# Patient Record
Sex: Female | Born: 1990 | Race: Black or African American | Hispanic: No | Marital: Single | Smoking: Never smoker
Health system: Southern US, Community
[De-identification: ages and names within clinical notes are randomized; demographics above are authoritative.]

## PROBLEM LIST (undated history)

## (undated) HISTORY — PX: CHOLECYSTECTOMY: SHX55

---
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
## 2014-01-14 ENCOUNTER — Encounter (HOSPITAL_COMMUNITY): Payer: Self-pay | Admitting: Emergency Medicine

## 2014-01-14 ENCOUNTER — Emergency Department (HOSPITAL_COMMUNITY)
Admission: EM | Admit: 2014-01-14 | Discharge: 2014-01-14 | Disposition: A | Discharge status: Home / Self Care | Attending: Emergency Medicine | Admitting: Emergency Medicine

## 2014-01-14 DIAGNOSIS — A088 Other specified intestinal infections: Secondary | ICD-10-CM | POA: Insufficient documentation

## 2014-01-14 DIAGNOSIS — A084 Viral intestinal infection, unspecified: Secondary | ICD-10-CM

## 2014-01-14 DIAGNOSIS — Z3202 Encounter for pregnancy test, result negative: Secondary | ICD-10-CM | POA: Insufficient documentation

## 2014-01-14 LAB — CBC WITH DIFFERENTIAL/PLATELET
BASOS ABS: 0 10*3/uL (ref 0.0–0.1)
Basophils Relative: 0 % (ref 0–1)
Eosinophils Absolute: 0.2 10*3/uL (ref 0.0–0.7)
Eosinophils Relative: 4 % (ref 0–5)
HEMATOCRIT: 37.4 % (ref 36.0–46.0)
Hemoglobin: 12.8 g/dL (ref 12.0–15.0)
LYMPHS ABS: 1.2 10*3/uL (ref 0.7–4.0)
LYMPHS PCT: 21 % (ref 12–46)
MCH: 29.5 pg (ref 26.0–34.0)
MCHC: 34.2 g/dL (ref 30.0–36.0)
MCV: 86.2 fL (ref 78.0–100.0)
MONO ABS: 0.3 10*3/uL (ref 0.1–1.0)
Monocytes Relative: 6 % (ref 3–12)
NEUTROS ABS: 4 10*3/uL (ref 1.7–7.7)
Neutrophils Relative %: 69 % (ref 43–77)
Platelets: 220 10*3/uL (ref 150–400)
RBC: 4.34 MIL/uL (ref 3.87–5.11)
RDW: 12.5 % (ref 11.5–15.5)
WBC: 5.8 10*3/uL (ref 4.0–10.5)

## 2014-01-14 LAB — URINALYSIS, ROUTINE W REFLEX MICROSCOPIC
BILIRUBIN URINE: NEGATIVE
GLUCOSE, UA: NEGATIVE mg/dL
Hgb urine dipstick: NEGATIVE
Ketones, ur: NEGATIVE mg/dL
LEUKOCYTES UA: NEGATIVE
NITRITE: NEGATIVE
PH: 6 (ref 5.0–8.0)
Protein, ur: NEGATIVE mg/dL
SPECIFIC GRAVITY, URINE: 1.008 (ref 1.005–1.030)
Urobilinogen, UA: 0.2 mg/dL (ref 0.0–1.0)

## 2014-01-14 LAB — PREGNANCY, URINE: Preg Test, Ur: NEGATIVE

## 2014-01-14 MED ORDER — ONDANSETRON HCL 4 MG PO TABS: 4.0000 mg | ORAL_TABLET | Freq: Three times a day (TID) | ORAL | Status: AC | PRN

## 2014-01-14 MED ORDER — ONDANSETRON HCL 4 MG/2ML IJ SOLN
4.0000 mg | Freq: Once | INTRAMUSCULAR | Status: AC
  Administered 2014-01-14: 4 mg via INTRAVENOUS
  Filled 2014-01-14: qty 2

## 2014-01-14 NOTE — ED Notes (Signed)
Pt arrived to the ED with a complaint of emesis and diarrhea.  Pt states she came to the ball game tonight, had not eaten anything.  Had an episode of emesis in route.  Pt had another episode when she arrived to the collesium.  Pt was given 4 mg of Zofran by EMS.

## 2014-01-14 NOTE — Discharge Instructions (Signed)
Viral Gastroenteritis Viral gastroenteritis is also known as stomach flu. This condition affects the stomach and intestinal tract. It can cause sudden diarrhea and vomiting. The illness typically lasts 3 to 8 days. Most people develop an immune response that eventually gets rid of the virus. While this natural response develops, the virus can make you quite ill. CAUSES  Many different viruses can cause gastroenteritis, such as rotavirus or noroviruses. You can catch one of these viruses by consuming contaminated food or water. You may also catch a virus by sharing utensils or other personal items with an infected person or by touching a contaminated surface. SYMPTOMS  The most common symptoms are diarrhea and vomiting. These problems can cause a severe loss of body fluids (dehydration) and a body salt (electrolyte) imbalance. Other symptoms may include:  Fever.  Headache.  Fatigue.  Abdominal pain. DIAGNOSIS  Your caregiver can usually diagnose viral gastroenteritis based on your symptoms and a physical exam. A stool sample may also be taken to test for the presence of viruses or other infections. TREATMENT  This illness typically goes away on its own. Treatments are aimed at rehydration. The most serious cases of viral gastroenteritis involve vomiting so severely that you are not able to keep fluids down. In these cases, fluids must be given through an intravenous line (IV). HOME CARE INSTRUCTIONS   Drink enough fluids to keep your urine clear or pale yellow. Drink small amounts of fluids frequently and increase the amounts as tolerated.  Ask your caregiver for specific rehydration instructions.  Avoid:  Foods high in sugar.  Alcohol.  Carbonated drinks.  Tobacco.  Juice.  Caffeine drinks.  Extremely hot or cold fluids.  Fatty, greasy foods.  Too much intake of anything at one time.  Dairy products until 24 to 48 hours after diarrhea stops.  You may consume probiotics.  Probiotics are active cultures of beneficial bacteria. They may lessen the amount and number of diarrheal stools in adults. Probiotics can be found in yogurt with active cultures and in supplements.  Wash your hands well to avoid spreading the virus.  Only take over-the-counter or prescription medicines for pain, discomfort, or fever as directed by your caregiver. Do not give aspirin to children. Antidiarrheal medicines are not recommended.  Ask your caregiver if you should continue to take your regular prescribed and over-the-counter medicines.  Keep all follow-up appointments as directed by your caregiver. SEEK IMMEDIATE MEDICAL CARE IF:   You are unable to keep fluids down.  You do not urinate at least once every 6 to 8 hours.  You develop shortness of breath.  You notice blood in your stool or vomit. This may look like coffee grounds.  You have abdominal pain that increases or is concentrated in one small area (localized).  You have persistent vomiting or diarrhea.  You have a fever.  The patient is a child younger than 3 months, and he or she has a fever.  The patient is a child older than 3 months, and he or she has a fever and persistent symptoms.  The patient is a child older than 3 months, and he or she has a fever and symptoms suddenly get worse.  The patient is a baby, and he or she has no tears when crying. MAKE SURE YOU:   Understand these instructions.  Will watch your condition.  Will get help right away if you are not doing well or get worse. Document Released: 10/07/2005 Document Revised: 12/30/2011 Document Reviewed: 07/24/2011   [employer] Patient Information 2014 [employer]  

## 2014-01-14 NOTE — ED Notes (Signed)
Bed: WA17 Expected date:  Expected time:  Means of arrival:  Comments: EMS 22yo N/V/D

## 2014-01-14 NOTE — ED Provider Notes (Signed)
CSN: [id number]     Arrival date & time 01/14/14  2113 History   First MD Initiated Contact with Patient 01/14/14 2146     Chief Complaint  Patient presents with  . Emesis  . Diarrhea   HPI  Shannon Wheeler is 23 y.o. female presented to the ED with complaints of nausea, vomit and diarrhea that started 2 hours ago. Mother accompanies patient, they state they were on the way to the [place] from [place] and patient suddenly felt the need to vomit. Patient's family pulled over breast area which her daughter started "persistently vomiting ". Mother gave her some crackers and Gatorade and  the pt started vomited immediately. Patient reports her vomit and diarrhea were non-bloody. She endorses chills. No fever, cough, abdominal pain, headache or sore throat.  Patient's last  meal was this morning, a frozen sausage biscuit. She and her family have been eating out them and no one else is ill.No sick contacts. Pt has had a cholecystectomy.   History reviewed. No pertinent past medical history. Past Surgical History  Procedure Laterality Date  . Cholecystectomy     History reviewed. No pertinent family history. History  Substance Use Topics  . Smoking status: Never Smoker   . Smokeless tobacco: Not on file  . Alcohol Use: No   OB History   Grav Para Term Preterm Abortions TAB SAB Ect Mult Living                 Review of Systems  Constitutional: Positive for chills and appetite change. Negative for fever, diaphoresis, activity change, fatigue and unexpected weight change.  HENT: Negative for congestion, rhinorrhea and sore throat.   Eyes: Negative for pain and redness.  Respiratory: Negative for cough, chest tightness, shortness of breath and wheezing.   Cardiovascular: Negative for chest pain and palpitations.  Gastrointestinal: Positive for nausea, vomiting and diarrhea. Negative for abdominal pain, constipation and blood in stool.  Genitourinary: Negative.   Musculoskeletal:  Negative.   Skin: Negative.   Neurological: Negative.    Allergies  Review of patient's allergies indicates not on file.  Home Medications  No current outpatient prescriptions on file. BP 133/74  Pulse 75  Temp(Src) 97.6 F (36.4 C) (Oral)  Resp 16  SpO2 97% Physical Exam NWG:NFAOZHYQGen:Pleasant. No acute distress nontoxic in appearance HEENT: AT. [place]. Bilateral TM visualized and normal in appearance. Bilateral eyes without injections or icterus. MMM. Bilateral nares without erythema. Throat without erythema or exudates.  CV: RRR . 1/6 systolic murmur. No clicks gallops or rubs Chest: CTAB, no wheeze or crackles Abd: Soft. NTND. BS present. No Masses palpated.  Ext: No erythema. No edema.  Skin: No rashes, purpura or petechiae.  Neuro:  Normal gait. PERLA. EOMi. Alert. Grossly intact.  Psych: Normal dress, affect, mood and speech.  ED Course  Procedures (including critical care time) Labs Review Labs Reviewed - No data to display Imaging Review No results found.   EKG Interpretation None      MDM   Final diagnoses:  None   Patient presents to ED with nausea, vomiting and diarrhea that started acutely 2 hours ago. Likely viral gastroenteritis. Patient was given IV fluids and Zofran. She was then by mouth challenged and tolerated crackers and water. Pregnancy test was negative. Urinalysis is negative. Patient was felt to be stable for discharge with mother. Patient was given prescription for Zofran. Encouraged to stay well-hydrated. Followup with PCP on Monday.   Natalia Leatherwoodenee A Kuneff, DO 01/14/14 2317

## 2014-01-16 NOTE — ED Provider Notes (Signed)
Medical screening examination/treatment/procedure(s) were performed by non-physician practitioner and as supervising physician I was immediately available for consultation/collaboration.   Jamesmichael Shadd L Baptiste Littler, MD 01/16/14 1356
